# Patient Record
Sex: Male | Born: 1995 | Race: White | Hispanic: No | Marital: Single | Smoking: Never smoker
Health system: Southern US, Community
[De-identification: ages and names within clinical notes are randomized; demographics above are authoritative.]

## PROBLEM LIST (undated history)

## (undated) DIAGNOSIS — T7840XA Allergy, unspecified, initial encounter: Secondary | ICD-10-CM

---
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
## 2014-01-05 ENCOUNTER — Other Ambulatory Visit: Payer: Self-pay

## 2014-03-28 ENCOUNTER — Ambulatory Visit (INDEPENDENT_AMBULATORY_CARE_PROVIDER_SITE_OTHER): Payer: 59 | Admitting: Emergency Medicine

## 2014-03-28 VITALS — BP 114/72 | HR 61 | Temp 98.8°F | Resp 16 | Ht 66.25 in | Wt 131.0 lb

## 2014-03-28 DIAGNOSIS — B081 Molluscum contagiosum: Secondary | ICD-10-CM

## 2014-03-28 NOTE — Progress Notes (Addendum)
   This chart was scribed for Erik GobbleSteven A Saadia Dewitt, MD by Tonye RoyaltyJoshua Chen, ED Scribe. This patient was seen in room 10 and the patient's care was started at 9:36 AM.   Subjective:    Patient ID: Erik Morgan, male    DOB: 04-30-1995, 19 y.o.   MRN: [id number]  HPI  HPI Comments: Erik Beachyler Morissette is a 19 y.o. male who presents to the Emergency Department complaining of recurrence of molluscumto his genital area with onset the past few months, requesting treatment with liquid nitrogen. He states symptoms improved after treatment on 09/08/2013, but some have recurred since then. He notes he has had mono in the mean time.  Review of Systems  Constitutional: Negative for fever.  Genitourinary:       Molluscum       Objective:   Physical Exam  CONSTITUTIONAL: Well developed/well nourished HEAD: Normocephalic/atraumatic EYES: EOMI/PERRL ENMT: Mucous membranes moist NECK: supple no meningeal signs SPINE/BACK:entire spine nontender CV: S1/S2 noted, no murmurs/rubs/gallops noted LUNGS: Lungs are clear to auscultation bilaterally, no apparent distress ABDOMEN: soft, nontender, no rebound or guarding, bowel sounds noted throughout abdomen GU:no cva tenderness, 7 identified 2mm molluscum involving the mons, pubis, shaft of the penis, and right side of his groin NEURO: Pt is awake/alert/appropriate, moves all extremitiesx4.  No facial droop.   EXTREMITIES: pulses normal/equal, full ROM SKIN: warm, color normal PSYCH: no abnormalities of mood noted, alert and oriented to situation   Procedure note: areas treated with liquid nitrogen for 4 seconds x2, patient tolerated well    Assessment & Plan:   Patient has recurrence of his molluscum. He has responded well to liquid nitrogen treatment in the past. This was performed again patient tolerated well he will recheck if the lesions recur.I personally performed the services described in this documentation, which was scribed in my presence. The recorded information  has been reviewed and is accurate.

## 2014-06-05 ENCOUNTER — Ambulatory Visit (INDEPENDENT_AMBULATORY_CARE_PROVIDER_SITE_OTHER): Payer: 59 | Admitting: Family Medicine

## 2014-06-05 DIAGNOSIS — S060X0A Concussion without loss of consciousness, initial encounter: Secondary | ICD-10-CM

## 2014-06-05 DIAGNOSIS — M545 Low back pain, unspecified: Secondary | ICD-10-CM

## 2014-06-05 DIAGNOSIS — M542 Cervicalgia: Secondary | ICD-10-CM

## 2014-06-05 MED ORDER — NAPROXEN 500 MG PO TABS: 500.0000 mg | ORAL_TABLET | Freq: Two times a day (BID) | ORAL | Status: DC

## 2014-06-05 MED ORDER — CYCLOBENZAPRINE HCL 10 MG PO TABS: 10.0000 mg | ORAL_TABLET | Freq: Three times a day (TID) | ORAL | Status: DC | PRN

## 2014-06-05 NOTE — Patient Instructions (Signed)
Please adhere to return to play guidelines provided to you in the office.
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
## 2014-06-11 ENCOUNTER — Ambulatory Visit (INDEPENDENT_AMBULATORY_CARE_PROVIDER_SITE_OTHER): Payer: 59 | Admitting: Family Medicine

## 2014-06-11 VITALS — BP 112/70 | HR 66 | Temp 98.1°F | Resp 16 | Ht 66.0 in | Wt 138.0 lb

## 2014-06-11 DIAGNOSIS — M549 Dorsalgia, unspecified: Secondary | ICD-10-CM

## 2014-06-11 DIAGNOSIS — M542 Cervicalgia: Secondary | ICD-10-CM

## 2014-06-11 DIAGNOSIS — S060X0S Concussion without loss of consciousness, sequela: Secondary | ICD-10-CM
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
## 2014-06-16 ENCOUNTER — Ambulatory Visit (INDEPENDENT_AMBULATORY_CARE_PROVIDER_SITE_OTHER): Payer: 59 | Admitting: Internal Medicine

## 2014-06-16 VITALS — BP 110/68 | HR 78 | Temp 98.0°F | Resp 18 | Ht 66.0 in | Wt 140.0 lb

## 2014-06-16 DIAGNOSIS — M542 Cervicalgia: Secondary | ICD-10-CM

## 2014-06-16 DIAGNOSIS — S060X0D Concussion without loss of consciousness, subsequent encounter: Secondary | ICD-10-CM

## 2014-06-16 DIAGNOSIS — M549 Dorsalgia, unspecified: Secondary | ICD-10-CM | POA: Diagnosis not present

## 2014-06-16 DIAGNOSIS — S060X0A Concussion without loss of consciousness, initial encounter: Secondary | ICD-10-CM | POA: Insufficient documentation
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
## 2014-08-15 ENCOUNTER — Other Ambulatory Visit: Payer: Self-pay | Admitting: Occupational Medicine

## 2014-08-15 ENCOUNTER — Ambulatory Visit
Admission: RE | Admit: 2014-08-15 | Discharge: 2014-08-15 | Disposition: A | Discharge status: Home / Self Care | Payer: No Typology Code available for payment source | Source: Ambulatory Visit | Attending: Occupational Medicine | Admitting: Occupational Medicine

## 2014-08-15 DIAGNOSIS — Z021 Encounter for pre-employment examination: Secondary | ICD-10-CM

## 2014-09-29 ENCOUNTER — Emergency Department (HOSPITAL_COMMUNITY): Payer: 59

## 2014-09-29 ENCOUNTER — Emergency Department (HOSPITAL_COMMUNITY)
Admission: EM | Admit: 2014-09-29 | Discharge: 2014-09-29 | Disposition: A | Discharge status: Home / Self Care | Payer: 59 | Attending: Emergency Medicine | Admitting: Emergency Medicine

## 2014-09-29 ENCOUNTER — Encounter (HOSPITAL_COMMUNITY): Payer: Self-pay | Admitting: Neurology

## 2014-09-29 DIAGNOSIS — S4992XA Unspecified injury of left shoulder and upper arm, initial encounter: Secondary | ICD-10-CM | POA: Insufficient documentation

## 2014-09-29 DIAGNOSIS — S39012A Strain of muscle, fascia and tendon of lower back, initial encounter: Secondary | ICD-10-CM

## 2014-09-29 DIAGNOSIS — R188 Other ascites: Secondary | ICD-10-CM | POA: Diagnosis not present

## 2014-09-29 DIAGNOSIS — S161XXA Strain of muscle, fascia and tendon at neck level, initial encounter: Secondary | ICD-10-CM | POA: Insufficient documentation

## 2014-09-29 DIAGNOSIS — S3991XA Unspecified injury of abdomen, initial encounter: Secondary | ICD-10-CM | POA: Insufficient documentation

## 2014-09-29 DIAGNOSIS — M25512 Pain in left shoulder: Secondary | ICD-10-CM

## 2014-09-29 DIAGNOSIS — S060X1A Concussion with loss of consciousness of 30 minutes or less, initial encounter: Secondary | ICD-10-CM | POA: Diagnosis not present

## 2014-09-29 DIAGNOSIS — S0990XA Unspecified injury of head, initial encounter: Secondary | ICD-10-CM | POA: Diagnosis present

## 2014-09-29 DIAGNOSIS — Y9389 Activity, other specified: Secondary | ICD-10-CM | POA: Insufficient documentation

## 2014-09-29 DIAGNOSIS — Z79899 Other long term (current) drug therapy: Secondary | ICD-10-CM | POA: Diagnosis not present

## 2014-09-29 DIAGNOSIS — R1030 Lower abdominal pain, unspecified: Secondary | ICD-10-CM

## 2014-09-29 DIAGNOSIS — Y92410 Unspecified street and highway as the place of occurrence of the external cause: Secondary | ICD-10-CM | POA: Diagnosis not present

## 2014-09-29 DIAGNOSIS — Y998 Other external cause status: Secondary | ICD-10-CM | POA: Diagnosis not present

## 2014-09-29 LAB — URINALYSIS, ROUTINE W REFLEX MICROSCOPIC
Bilirubin Urine: NEGATIVE
Glucose, UA: NEGATIVE mg/dL
Hgb urine dipstick: NEGATIVE
Ketones, ur: 15 mg/dL — AB
Leukocytes, UA: NEGATIVE
NITRITE: NEGATIVE
PROTEIN: NEGATIVE mg/dL
Specific Gravity, Urine: >1.046 — ABNORMAL HIGH (ref 1.005–1.030)
UROBILINOGEN UA: 0.2 mg/dL (ref 0.0–1.0)
pH: 6 (ref 5.0–8.0)

## 2014-09-29 LAB — BASIC METABOLIC PANEL
Anion gap: 8 (ref 5–15)
BUN: 17 mg/dL (ref 6–20)
CO2: 27 mmol/L (ref 22–32)
Calcium: 9.3 mg/dL (ref 8.9–10.3)
Chloride: 102 mmol/L (ref 101–111)
Creatinine, Ser: 0.96 mg/dL (ref 0.61–1.24)
GFR calc non Af Amer: >60 mL/min (ref >60)
GLUCOSE: 98 mg/dL (ref 65–99)
Potassium: 3.9 mmol/L (ref 3.5–5.1)
Sodium: 137 mmol/L (ref 135–145)

## 2014-09-29 LAB — CBC
HCT: 37.4 % — ABNORMAL LOW (ref 39.0–52.0)
Hemoglobin: 12.8 g/dL — ABNORMAL LOW (ref 13.0–17.0)
MCH: 31.4 pg (ref 26.0–34.0)
MCHC: 34.2 g/dL (ref 30.0–36.0)
MCV: 91.7 fL (ref 78.0–100.0)
Platelets: 222 10*3/uL (ref 150–400)
RBC: 4.08 MIL/uL — ABNORMAL LOW (ref 4.22–5.81)
RDW: 12.2 % (ref 11.5–15.5)
WBC: 4 10*3/uL (ref 4.0–10.5)

## 2014-09-29 MED ORDER — KETOROLAC TROMETHAMINE 30 MG/ML IJ SOLN
30.0000 mg | Freq: Once | INTRAMUSCULAR | Status: AC
  Administered 2014-09-29: 30 mg via INTRAVENOUS
  Filled 2014-09-29: qty 1

## 2014-09-29 MED ORDER — DIAZEPAM 5 MG/ML IJ SOLN
5.0000 mg | Freq: Once | INTRAMUSCULAR | Status: AC
  Administered 2014-09-29: 5 mg via INTRAVENOUS
  Filled 2014-09-29: qty 2

## 2014-09-29 MED ORDER — MORPHINE SULFATE 4 MG/ML IJ SOLN: 4.0000 mg | Freq: Once | INTRAMUSCULAR | Status: DC

## 2014-09-29 MED ORDER — IOHEXOL 300 MG/ML  SOLN
100.0000 mL | Freq: Once | INTRAMUSCULAR | Status: AC | PRN
  Administered 2014-09-29: 100 mL via INTRAVENOUS

## 2014-09-29 MED ORDER — DIAZEPAM 2 MG PO TABS: ORAL_TABLET | ORAL | Status: DC
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
## 2014-09-29 NOTE — ED Notes (Signed)
Pt stating that the pain medication he was given earlier did not help and is requesting valium or something to help with his muscle pain. Dr. Criss AlvineGoldston informed.

## 2014-09-29 NOTE — ED Notes (Signed)
Dr. Criss AlvineGoldston and PA Paulina FusiHess at bedside with patient and family.

## 2014-09-29 NOTE — ED Notes (Signed)
Dr. Ramirez at bedside  
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
## 2014-09-29 NOTE — ED Notes (Addendum)
PA Hess at bedside.  Patient log rolled.  C-spine supported by this RN.
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
## 2014-10-06 ENCOUNTER — Ambulatory Visit (INDEPENDENT_AMBULATORY_CARE_PROVIDER_SITE_OTHER): Payer: 59 | Admitting: Physician Assistant

## 2014-10-06 VITALS — BP 110/60 | HR 63 | Temp 97.9°F | Resp 18 | Ht 66.0 in | Wt 134.1 lb

## 2014-10-06 DIAGNOSIS — S060X1A Concussion with loss of consciousness of 30 minutes or less, initial encounter: Secondary | ICD-10-CM | POA: Diagnosis not present

## 2014-10-06 DIAGNOSIS — M545 Low back pain, unspecified: Secondary | ICD-10-CM

## 2014-10-06 DIAGNOSIS — M542 Cervicalgia: Secondary | ICD-10-CM | POA: Diagnosis not present
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
## 2014-10-06 NOTE — Patient Instructions (Signed)
Continue the naproxen twice daily. Use the Valium as needed (mostly at night, since it causes drowsiness). Apply a heating pad (consider the "stick on" kind for when you start work). Brain rest is just as important as body rest. You need to be very gentle with yourself until the headaches are GONE. If work makes them worse (in severity or frequency), you need to reduce the hours or intensity of the work until you are symptom free. If you have ANY worsening of your symptoms, or if they persist beyond another 2-3 weeks, please return for re-evaluation, as you may need referral to a neurologist.
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
## 2017-04-21 DIAGNOSIS — M25551 Pain in right hip: Secondary | ICD-10-CM | POA: Diagnosis not present

## 2017-04-30 ENCOUNTER — Encounter: Payer: 59 | Admitting: Physician Assistant

## 2017-10-13 ENCOUNTER — Emergency Department (HOSPITAL_COMMUNITY): Payer: No Typology Code available for payment source

## 2017-10-13 ENCOUNTER — Encounter (HOSPITAL_COMMUNITY): Payer: Self-pay | Admitting: *Deleted

## 2017-10-13 ENCOUNTER — Emergency Department (HOSPITAL_COMMUNITY)
Admission: EM | Admit: 2017-10-13 | Discharge: 2017-10-13 | Disposition: A | Discharge status: Home / Self Care | Payer: No Typology Code available for payment source | Attending: Emergency Medicine | Admitting: Emergency Medicine

## 2017-10-13 DIAGNOSIS — Z79899 Other long term (current) drug therapy: Secondary | ICD-10-CM | POA: Insufficient documentation

## 2017-10-13 DIAGNOSIS — Y33XXXA Other specified events, undetermined intent, initial encounter: Secondary | ICD-10-CM | POA: Diagnosis not present

## 2017-10-13 DIAGNOSIS — Y929 Unspecified place or not applicable: Secondary | ICD-10-CM | POA: Diagnosis not present

## 2017-10-13 DIAGNOSIS — Y998 Other external cause status: Secondary | ICD-10-CM | POA: Insufficient documentation

## 2017-10-13 DIAGNOSIS — Y939 Activity, unspecified: Secondary | ICD-10-CM | POA: Insufficient documentation

## 2017-10-13 DIAGNOSIS — S6392XA Sprain of unspecified part of left wrist and hand, initial encounter: Secondary | ICD-10-CM | POA: Diagnosis not present

## 2017-10-13 DIAGNOSIS — S6992XA Unspecified injury of left wrist, hand and finger(s), initial encounter: Secondary | ICD-10-CM | POA: Diagnosis present
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
## 2017-10-13 NOTE — ED Notes (Signed)
Bed: WTR5 Expected date:  Expected time:  Means of arrival:  Comments: 

## 2017-10-13 NOTE — Discharge Instructions (Signed)
Please read and follow all provided instructions.  Your diagnoses today include:  1. Sprain of left hand, initial encounter     Tests performed today include:  An x-ray of the affected area - does NOT show any broken bones  Vital signs. See below for your results today.   Medications prescribed:   None  Take any prescribed medications only as directed.  Home care instructions:   Follow any educational materials contained in this packet  Follow R.I.C.E. Protocol:  R - rest your injury   I  - use ice on injury without applying directly to skin  C - compress injury with bandage or splint  E - elevate the injury as much as possible  Follow-up instructions: Please follow-up with your primary care provider if you continue to have significant pain in 1 week. In this case you may have a more severe injury that requires further care.   Return instructions:   Please return if your fingers are numb or tingling, appear gray or blue, or you have severe pain (also elevate the arm and loosen splint or wrap if you were given one)  Please return to the Emergency Department if you experience worsening symptoms.   Please return if you have any other emergent concerns.  Additional Information:  Your vital signs today were: BP 117/86 (BP Location: Right Arm)    Pulse 94    Temp 98.4 F (36.9 C) (Oral)    Resp 16    SpO2 96%  If your blood pressure (BP) was elevated above 135/85 this visit, please have this repeated by your doctor within one month. --------------

## 2019-09-24 ENCOUNTER — Emergency Department (HOSPITAL_COMMUNITY)
Admission: EM | Admit: 2019-09-24 | Discharge: 2019-09-24 | Disposition: A | Discharge status: Home / Self Care | Payer: No Typology Code available for payment source | Attending: Emergency Medicine | Admitting: Emergency Medicine

## 2019-09-24 ENCOUNTER — Other Ambulatory Visit: Payer: Self-pay

## 2019-09-24 ENCOUNTER — Encounter (HOSPITAL_COMMUNITY): Payer: Self-pay | Admitting: Emergency Medicine

## 2019-09-24 DIAGNOSIS — Z7721 Contact with and (suspected) exposure to potentially hazardous body fluids: Secondary | ICD-10-CM

## 2019-09-24 DIAGNOSIS — Z79899 Other long term (current) drug therapy: Secondary | ICD-10-CM | POA: Insufficient documentation

## 2019-09-24 HISTORY — DX: Allergy, unspecified, initial encounter: T78.40XA

## 2019-09-24 LAB — COMPREHENSIVE METABOLIC PANEL
ALT: 19 U/L (ref 0–44)
AST: 19 U/L (ref 15–41)
Albumin: 4.5 g/dL (ref 3.5–5.0)
Alkaline Phosphatase: 52 U/L (ref 38–126)
Anion gap: 10 (ref 5–15)
BUN: 22 mg/dL — ABNORMAL HIGH (ref 6–20)
CO2: 24 mmol/L (ref 22–32)
Calcium: 9.8 mg/dL (ref 8.9–10.3)
Chloride: 104 mmol/L (ref 98–111)
Creatinine, Ser: 0.94 mg/dL (ref 0.61–1.24)
GFR calc Af Amer: >60 mL/min (ref >60)
GFR calc non Af Amer: >60 mL/min (ref >60)
Glucose, Bld: 93 mg/dL (ref 70–99)
Potassium: 4.4 mmol/L (ref 3.5–5.1)
Sodium: 138 mmol/L (ref 135–145)
Total Bilirubin: 0.8 mg/dL (ref 0.3–1.2)
Total Protein: 7.3 g/dL (ref 6.5–8.1)

## 2019-09-24 LAB — RAPID HIV SCREEN (HIV 1/2 AB+AG)
HIV 1/2 Antibodies: NONREACTIVE
HIV-1 P24 Antigen - HIV24: NONREACTIVE

## 2019-09-24 LAB — HEPATITIS C ANTIBODY: HCV Ab: NONREACTIVE

## 2019-09-24 LAB — HEPATITIS B SURFACE ANTIGEN: Hepatitis B Surface Ag: NONREACTIVE

## 2019-09-24 MED ORDER — BIKTARVY 50-200-25 MG PO TABS: 1.0000 | ORAL_TABLET | Freq: Every day | ORAL | 0 refills | Status: DC

## 2019-09-24 MED ORDER — BICTEGRAVIR-EMTRICITAB-TENOFOV 50-200-25 MG PO TABS
1.0000 | ORAL_TABLET | Freq: Once | ORAL | Status: AC
  Administered 2019-09-24: 1 via ORAL
  Filled 2019-09-24: qty 1
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
## 2019-09-24 NOTE — Discharge Instructions (Signed)
While on the [place], do not take Ibuprofen/Advil/Aleve/Motrin/Goody Powders/Naproxen/BC powders/Meloxicam/Diclofenac/Indomethacin and other Nonsteroidal anti-inflammatory medications
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
## 2019-09-25 LAB — RPR: RPR Ser Ql: NONREACTIVE
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
## 2021-07-15 ENCOUNTER — Encounter (HOSPITAL_BASED_OUTPATIENT_CLINIC_OR_DEPARTMENT_OTHER): Payer: Self-pay

## 2021-07-15 ENCOUNTER — Emergency Department (HOSPITAL_BASED_OUTPATIENT_CLINIC_OR_DEPARTMENT_OTHER)
Admission: EM | Admit: 2021-07-15 | Discharge: 2021-07-15 | Disposition: A | Discharge status: Home / Self Care | Payer: No Typology Code available for payment source | Attending: Emergency Medicine | Admitting: Emergency Medicine

## 2021-07-15 ENCOUNTER — Other Ambulatory Visit: Payer: Self-pay

## 2021-07-15 DIAGNOSIS — S6992XA Unspecified injury of left wrist, hand and finger(s), initial encounter: Secondary | ICD-10-CM | POA: Diagnosis present

## 2021-07-15 DIAGNOSIS — Z7721 Contact with and (suspected) exposure to potentially hazardous body fluids: Secondary | ICD-10-CM | POA: Insufficient documentation

## 2021-07-15 DIAGNOSIS — S60512A Abrasion of left hand, initial encounter: Secondary | ICD-10-CM | POA: Diagnosis not present

## 2021-07-15 DIAGNOSIS — Y990 Civilian activity done for income or pay: Secondary | ICD-10-CM | POA: Insufficient documentation

## 2021-07-15 LAB — COMPREHENSIVE METABOLIC PANEL
ALT: 19 U/L (ref 0–44)
AST: 20 U/L (ref 15–41)
Albumin: 5 g/dL (ref 3.5–5.0)
Alkaline Phosphatase: 46 U/L (ref 38–126)
Anion gap: 9 (ref 5–15)
BUN: 19 mg/dL (ref 6–20)
CO2: 29 mmol/L (ref 22–32)
Calcium: 10.2 mg/dL (ref 8.9–10.3)
Chloride: 101 mmol/L (ref 98–111)
Creatinine, Ser: 0.96 mg/dL (ref 0.61–1.24)
GFR, Estimated: >60 mL/min (ref >60)
Glucose, Bld: 84 mg/dL (ref 70–99)
Potassium: 3.8 mmol/L (ref 3.5–5.1)
Sodium: 139 mmol/L (ref 135–145)
Total Bilirubin: 0.6 mg/dL (ref 0.3–1.2)
Total Protein: 8 g/dL (ref 6.5–8.1)

## 2021-07-15 LAB — RAPID HIV SCREEN (HIV 1/2 AB+AG)
HIV 1/2 Antibodies: NONREACTIVE
HIV-1 P24 Antigen - HIV24: NONREACTIVE

## 2021-07-15 MED ORDER — ELVITEG-COBIC-EMTRICIT-TENOFAF 150-150-200-10 MG PO TABS: 1.0000 | ORAL_TABLET | Freq: Every day | ORAL | 0 refills | Status: AC

## 2021-07-15 MED ORDER — ELVITEG-COBIC-EMTRICIT-TENOFAF 150-150-200-10 MG PREPACK
1.0000 | ORAL_TABLET | Freq: Once | ORAL | Status: AC
  Administered 2021-07-15: 1 via ORAL
  Filled 2021-07-15: qty 1
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
## 2021-07-15 NOTE — ED Notes (Signed)
Pt verbalizes understanding of discharge instructions. Opportunity for questioning and answers were provided. Pt discharged from ED to home.   ? ?
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
## 2021-07-15 NOTE — Discharge Instructions (Addendum)
Take the medication as prescribed ? ?Follow-up with primary care provider ? ?Return for any new or worsening symptoms ?

## 2021-07-16 ENCOUNTER — Telehealth: Payer: Self-pay | Admitting: *Deleted

## 2021-07-16 ENCOUNTER — Other Ambulatory Visit (HOSPITAL_COMMUNITY): Payer: Self-pay

## 2021-07-16 LAB — HEPATITIS C ANTIBODY: HCV Ab: NONREACTIVE

## 2021-07-16 LAB — HEPATITIS B SURFACE ANTIGEN: Hepatitis B Surface Ag: NONREACTIVE

## 2021-07-16 NOTE — Telephone Encounter (Signed)
RNCM placed call to pt regarding enrolling in [employer] savings program.  RNCM reached a generic voicemail and did not leave message.  Pt co-pay is $50.00. ?

## 2021-07-16 NOTE — TOC Benefit Eligibility Note (Addendum)
Patient Advocate Encounter ? ?Insurance verification completed.   ? ?The patient is currently admitted and upon discharge could be taking Genvoya 150-150-10 mg tablets. ? ?The current 30 day co-pay is, $50.00.  ? ?The patient is insured through [employer]  ? ? ? ?Roland Earl, CPhT ?Pharmacy Patient Advocate Specialist ?[facility] Patient Advocate Team ?Direct Number: [phone]  Fax: [phone] ? ? ? ? ? ?  ?

## 2021-07-17 LAB — RPR: RPR Ser Ql: NONREACTIVE
